# Patient Record
Sex: Female | Born: 2005 | Race: Black or African American | Hispanic: No | Marital: Single | Smoking: Never smoker
Health system: Southern US, Community
[De-identification: ages and names within clinical notes are randomized; demographics above are authoritative.]

---
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
## 2023-05-21 IMAGING — MR MR KNEE*L* W/O CM
7 series · 35 of 40 positions shown · non-contrast
Comparison: Left knee radiographs 07/10/2021 and bilateral patellar
sunrise views 08/03/2021

CLINICAL DATA: Injury while dancing 1 month ago. Left knee pain.
Evaluate for meniscal tear.

EXAM:
MRI OF THE LEFT KNEE WITHOUT CONTRAST
TECHNIQUE: Multiplanar, multisequence MR imaging of the knee was performed. No
intravenous contrast was administered.

[Series 6: T2 fat-sat · axial · left · 4.0mm · 0.50mm/px · z∈[-45,+108]mm · 6 of 36 slices shown (1 of 3)]
[im 1/36]
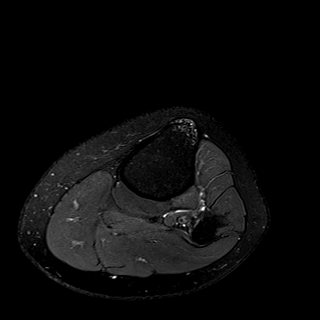
[im 8/36]
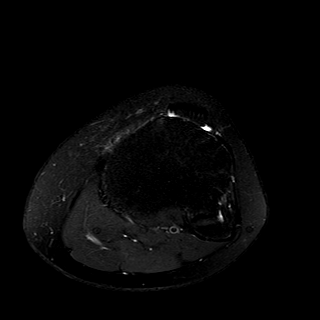
[im 15/36]
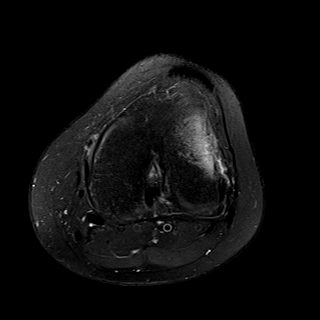
[im 22/36]
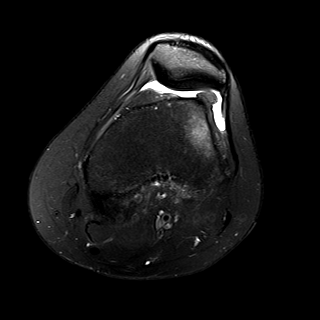
[im 29/36]
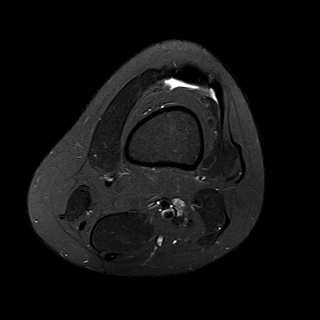
[im 36/36]
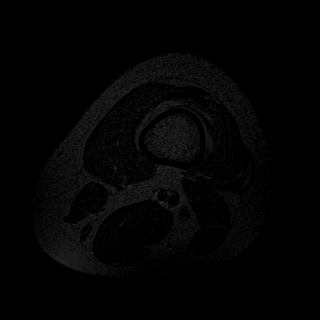

[Series 7: T2 fat-sat · coronal · left · 4.0mm · 0.39mm/px · 6 of 28 slices shown (2 of 3)]
[im 1/28]
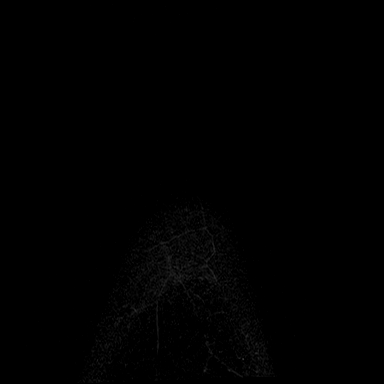
[im 6/28]
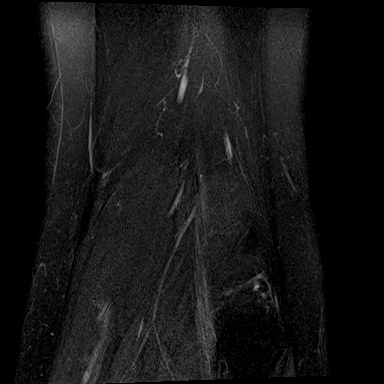
[im 11/28]
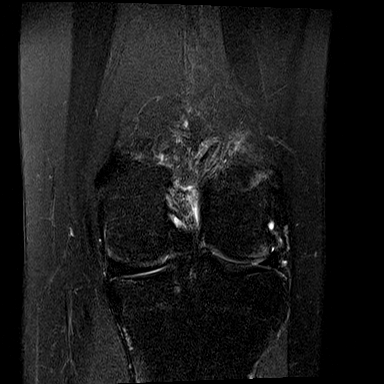
[im 17/28]
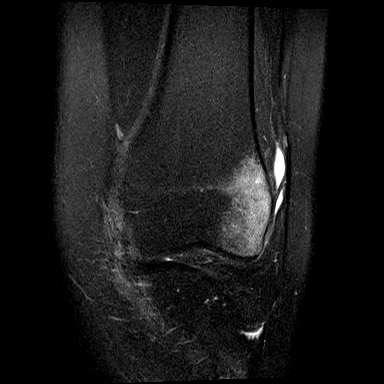
[im 22/28]
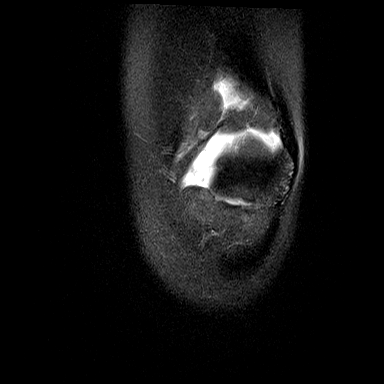
[im 28/28]
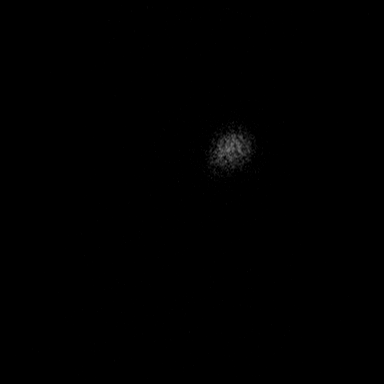

[Series 8: T1 · coronal · left · 4.0mm · 0.39mm/px · 1 of 28 slices shown]
[im 1/28]
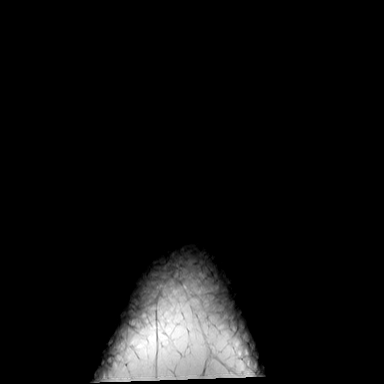

[Series 9: PD fat-sat · coronal · left · 3.0mm · 0.47mm/px · 6 of 28 slices shown (1 of 2)]
[im 1/28]
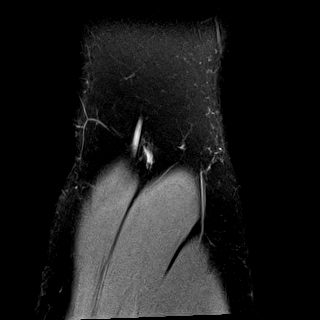
[im 6/28]
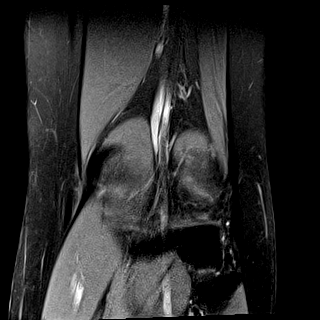
[im 11/28]
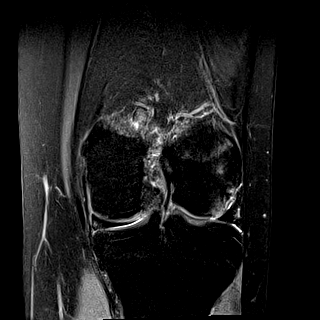
[im 17/28]
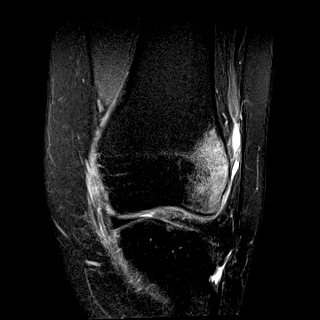
[im 22/28]
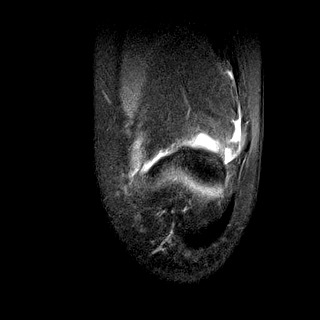
[im 28/28]
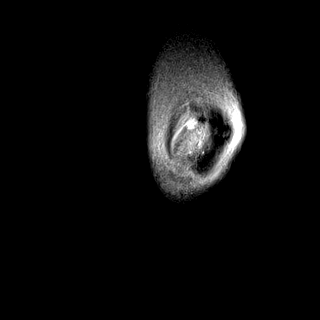

[Series 11: T2 fat-sat · sagittal · left · 3.0mm · 0.39mm/px · 6 of 27 slices shown (3 of 3)]
[im 1/27]
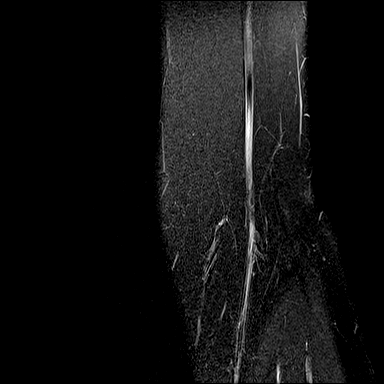
[im 6/27]
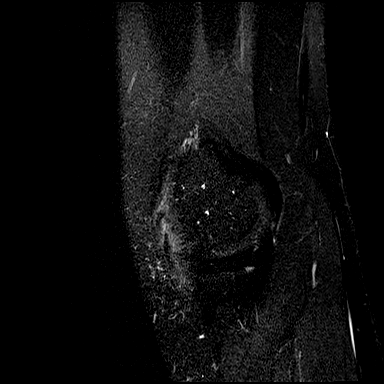
[im 11/27]
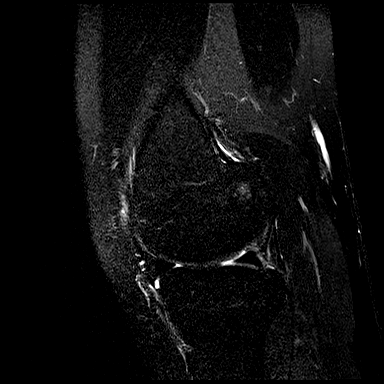
[im 16/27]
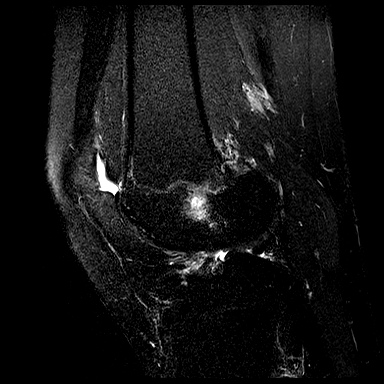
[im 21/27]
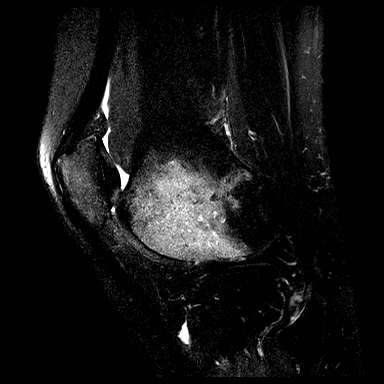
[im 27/27]
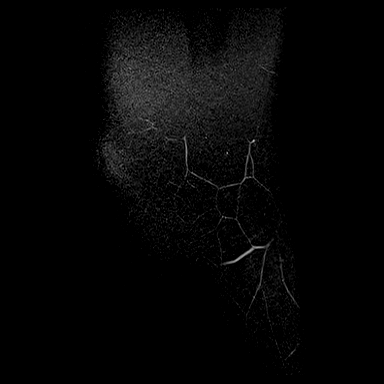

[Series 12: PD fat-sat · sagittal · left · 3.0mm · 0.39mm/px · 6 of 27 slices shown (2 of 2)]
[im 1/27]
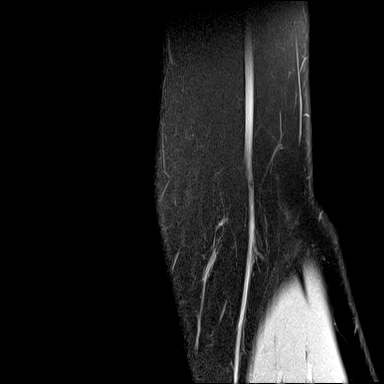
[im 6/27]
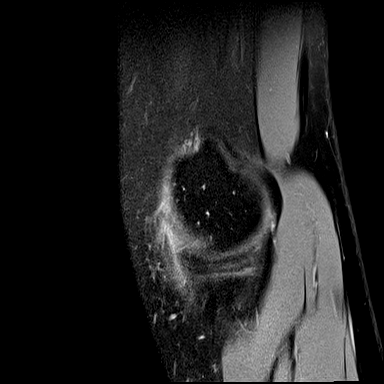
[im 11/27]
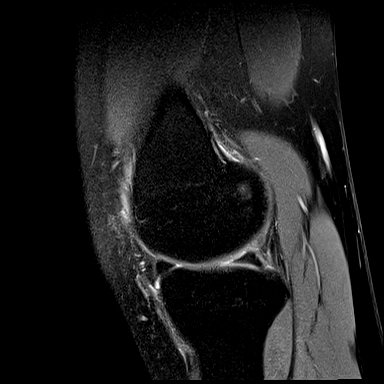
[im 16/27]
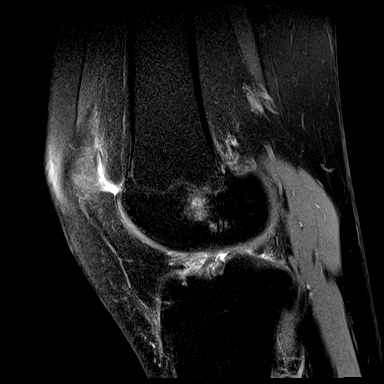
[im 21/27]
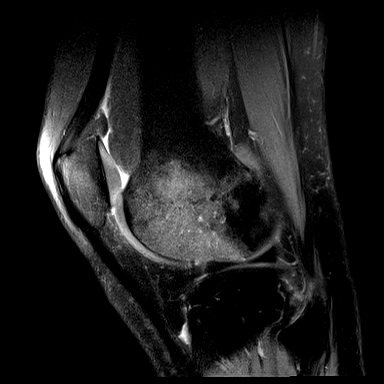
[im 27/27]
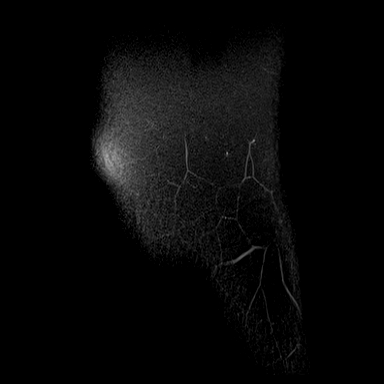

[Series 13: PD · oblique · left · 1.5mm · 0.44mm/px · 4 of 19 slices shown]
[im 1/19]
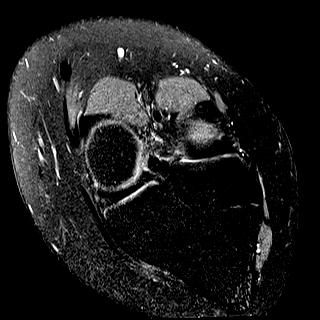
[im 7/19]
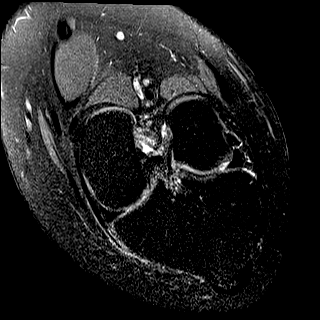
[im 13/19]
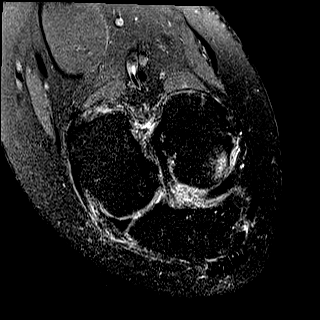
[im 19/19]
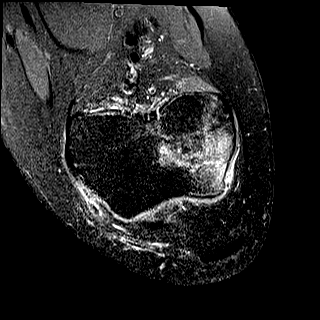

[35 of 40 positions shown; findings below may reference images not displayed]

FINDINGS: MENISCI

Medial meniscus: There is intermediate proton density signal
intrasubstance degeneration within the junction of the body and
posterior horn of the medial meniscus. There is mild curvilinear
increased proton density signal overlying the inferior peripheral
aspect of the meniscal triangle of the body of the medial meniscus
on coronal series 9, images 12 and 13 that is favored to be
artifactual as it extends both within and outside of the meniscal
triangle. No definite tear is seen extending through the articular
surface of the medial meniscus.

Lateral meniscus:  Intact.

LIGAMENTS

Cruciates: The ACL and PCL are intact.

Collaterals: The medial collateral ligament is intact. The fibular
collateral ligament, biceps femoris tendon, iliotibial band, and
popliteus tendon are intact.

CARTILAGE

Patellofemoral:  Intact.

Medial:  Intact.

Lateral:  Intact.

Joint: Tinyjoint effusion. Mild edema within the superolateral
aspect of Hoffa's fat pad. The tibial tuberosity-trochlear groove
distance measures 14 mm, at the upper limits of normal. The
trochlear notch is shallow. Mild approximate 7 mm lateralization of
the patellar apex with respect of the trochlear notch.

Popliteal Fossa:  No Baker's cyst.

Extensor Mechanism: Intact quadriceps tendon and patellar tendon.
The lateral patellofemoral retinaculum is intact. Mild edema within
the medial patellofemoral retinaculum insertion on the medial
femoral condyle (axial series 6 images 17 through 20) suggesting
recent sprain/injury.

Bones: There is high-grade marrow edema throughout the anterior
aspect of the lateral femoral condyle and trochlea. Mild focal
marrow edema within the superomedial patella (axial series 6, image
13, sagittal image 19). Additional trace marrow edema within the
inferior medial aspect of the patellar apex (axial image 17).

Other: There is a well corticated marrow fat intensity tiny loose
body measuring up to 4 mm within the lateral patellofemoral joint
space (coronal series 8, image 19, axial series 6, image 14,
sagittal image 24).
IMPRESSION: 1. Mild intrasubstance degeneration within the junction of the body
and posterior horn of the medial meniscus. No definite tear is seen
extending through the articular surface.
2. High-grade marrow edema within the anterior aspect of the lateral
femoral condyle and lateral trochlea. Mild focal marrow edema within
the superior and inferior medial aspects of the patella. This raises
the question of "kissing contusions" from recent transient lateral
patellar dislocation. In support of this, there appears to be a
sprain of the medial patellofemoral retinaculum insertion on the
medial femoral condyle. There is also a shallow trochlear notch
which increases predisposition to patellar instability. The patella
is mildly laterally positioned. The tibial tuberosity-trochlear
groove is at the upper limits of normal.
3. There is a 4 mm loose body within the lateral patellofemoral
joint space.
